# Patient Record
Sex: Female | Born: 2005 | Race: Black or African American | Hispanic: No | Marital: Single | Smoking: Never smoker
Health system: Southern US, Community
[De-identification: ages and names within clinical notes are randomized; demographics above are authoritative.]

## PROBLEM LIST (undated history)

## (undated) DIAGNOSIS — Z789 Other specified health status: Secondary | ICD-10-CM

## (undated) DIAGNOSIS — T7840XA Allergy, unspecified, initial encounter: Secondary | ICD-10-CM

## (undated) HISTORY — DX: Allergy, unspecified, initial encounter: T78.40XA

## (undated) HISTORY — PX: WISDOM TOOTH EXTRACTION: SHX21

## (undated) HISTORY — PX: NO PAST SURGERIES: SHX2092

---
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
## 2023-07-16 ENCOUNTER — Other Ambulatory Visit: Payer: Self-pay | Admitting: Family Medicine

## 2023-07-16 ENCOUNTER — Ambulatory Visit
Admission: RE | Admit: 2023-07-16 | Discharge: 2023-07-16 | Disposition: A | Discharge status: Home / Self Care | Payer: Medicaid Other | Attending: Family Medicine | Admitting: Family Medicine

## 2023-07-16 ENCOUNTER — Ambulatory Visit
Admission: RE | Admit: 2023-07-16 | Discharge: 2023-07-16 | Disposition: A | Discharge status: Home / Self Care | Payer: Medicaid Other | Source: Ambulatory Visit | Attending: Family Medicine | Admitting: Family Medicine

## 2023-07-16 DIAGNOSIS — G8929 Other chronic pain: Secondary | ICD-10-CM | POA: Insufficient documentation

## 2023-07-16 DIAGNOSIS — M545 Low back pain, unspecified: Secondary | ICD-10-CM | POA: Diagnosis present

## 2023-07-16 DIAGNOSIS — M549 Dorsalgia, unspecified: Secondary | ICD-10-CM

## 2023-07-25 ENCOUNTER — Ambulatory Visit: Payer: Medicaid Other | Attending: Family Medicine

## 2023-07-25 DIAGNOSIS — M545 Low back pain, unspecified: Secondary | ICD-10-CM | POA: Insufficient documentation

## 2023-07-25 DIAGNOSIS — M549 Dorsalgia, unspecified: Secondary | ICD-10-CM | POA: Insufficient documentation

## 2023-07-25 DIAGNOSIS — G8929 Other chronic pain: Secondary | ICD-10-CM | POA: Insufficient documentation

## 2023-07-25 NOTE — Therapy (Signed)
OUTPATIENT PHYSICAL THERAPY THORACOLUMBAR EVALUATION   Patient Name: Denise Thomas MRN: [id number] DOB:11-30-2005, 17 y.o., female Today's Date: 07/25/2023  END OF SESSION:  PT End of Session - 07/25/23 1409     Visit Number 1    Number of Visits 13    Authorization Type requested 12 visits (2x/week x 6 weeks)    PT Start Time 1415    PT Stop Time 1500    PT Time Calculation (min) 45 min    Activity Tolerance Patient tolerated treatment well    Behavior During Therapy WFL for tasks assessed/performed             Past Medical History:  Diagnosis Date   Allergies    Medical history non-contributory    Past Surgical History:  Procedure Laterality Date   NO PAST SURGERIES     WISDOM TOOTH EXTRACTION Bilateral    Patient Active Problem List   Diagnosis Date Noted   Term pregnancy 09/05/2021   Indication for care in labor and delivery, antepartum 09/03/2021   Pregnancy 07/14/2021   Type A blood, Rh positive 02/23/2021   Asthma 01/31/2021   Supervision of normal first teen pregnancy 01/31/2021    PCP: Dr. Letta Pate, MD  REFERRING PROVIDER: same  REFERRING DIAG: back pain  Rationale for Evaluation and Treatment: Rehabilitation  THERAPY DIAG:  Upper back pain  Chronic low back pain without sciatica, unspecified back pain laterality  ONSET DATE: chronic since middle schoool  SUBJECTIVE:                                                                                                                                                                                           SUBJECTIVE STATEMENT: Pt reports she is having back pain- lower, middle, and neck; no MOI; no N/T; insidous onset as a kid; she reports sx have been fairly constant at some level for years; worsened in the past year  Low back- bending down, lifting, prolonged sitting; prolonged walking; cleaning activities  Mid/upper back- feels more like stiff and achy; worse with sitting  Feels better with  pressure on her lower back, lying down "hurts good"; naproxen  Has had back pain since being a kid; no MOI; insidious progressed 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 yes  PRECAUTIONS: None  RED FLAGS: None   WEIGHT BEARING RESTRICTIONS: No  FALLS:  Has patient fallen in last 6 months? 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  DIAGNOSTIC FINDINGS:  She reports she had an x-ray in her back and she reports she has "arthritis"  PATIENT SURVEYS:  FOTO 49/56  SCREENING FOR RED FLAGS: Bowel or bladder incontinence: No Spinal tumors: No Cauda equina syndrome: No Compression fracture: No Abdominal aneurysm: No  COGNITION: Overall cognitive status: Within functional limits for tasks assessed     POSTURE: rounded shoulders  PALPATION: (+) tightness/MTP b/l thoracolumbar paraspinals (-) reproduction of pain with PAM testing: CPA/UPA L1-5 (+) hypomobile mid thoracic spine with CPA; not painful Pt notes "improvement" in sx with P/A mob sacrum and R/L SIJ  LUMBAR ROM:   AROM eval   Flexion Full *  Extension Min limited *  Right lateral flexion Min limited *  Left lateral flexion Min limited *  Right rotation Min limited *  Left rotation Min limited *   (Blank rows = not tested) *=pain  LOWER EXTREMITY ROM:     Active  Right eval Left eval  Hip flexion WNL flex, IR/ER/Add WNL flex, IR/ER/Add  Hip extension    Hip abduction    Hip adduction    Hip internal rotation    Hip external rotation    Knee flexion    Knee extension    Ankle dorsiflexion    Ankle plantarflexion    Ankle inversion    Ankle eversion     (Blank rows = not tested)  LOWER EXTREMITY MMT:    MMT Right eval Left eval  Hip flexion 4+ 4+  Hip extension 4 4  Hip abduction 4 4  Hip adduction    Hip internal rotation    Hip external rotation    Knee flexion 5 5  Knee extension 5 5  Ankle dorsiflexion    Ankle plantarflexion    Ankle inversion    Ankle eversion     (Blank rows = not tested)  LUMBAR SPECIAL TESTS:  (+) lumbar ext/rot test b/l (+) active SLR test R and L- pt notes improvement in sx with manual stabilization of pelvis (+) impaired ability to activate TA with good motor control using PT verbal/ and PT and pt tactile cues  FUNCTIONAL TESTS:  Pt demonstrates ability to perform squat, sit to stand, and forward bend- she notes that repeated forward bending and lifting at work aggravates her sx typically  GAIT: Pt amb with decreased hip extension and decreased arm swing in clinic  TODAY'S TREATMENT:  DATE: 07/25/23  Initial evaluation performed today  PATIENT EDUCATION:  Education details: PT POC/goals, purpose of motor control retraining/strengthening Person educated: Patient Education method: Explanation Education comprehension: verbalized understanding  HOME EXERCISE PROGRAM: To be initiated  ASSESSMENT:  CLINICAL  IMPRESSION: Patient is a 17 y.o. F who was seen today for physical therapy evaluation and treatment for thoacolumbar pain.  Likely associated with motor control impairments in thoracolumbar mm.  She is motivated to participate in tx and should do well with PT to address the impairments below:   OBJECTIVE IMPAIRMENTS: decreased mobility, decreased ROM, decreased strength, impaired perceived functional ability, increased muscle spasms, and pain.   ACTIVITY LIMITATIONS: carrying, lifting, bending, and caring for others  PARTICIPATION LIMITATIONS: cleaning, community activity, school, and lifting her son in/out of car  PERSONAL FACTORS: Past/current experiences and Time since onset of injury/illness/exacerbation are also affecting patient's functional outcome.   REHAB POTENTIAL: Good  CLINICAL DECISION MAKING: Stable/uncomplicated  EVALUATION COMPLEXITY: Low   GOALS: Goals reviewed with patient? Yes  SHORT TERM GOALS: Target date: 08/13/23  Pt will demonstrate ability to perform squat, floor to waist lift with good body mechanics lifting 10 lbs Baseline: able to perform the motion, does not use LE biomechanics efficiently Goal status: INITIAL    LONG TERM GOALS: Target date: 09/05/23  Improve FOTO to >56 indicating pt able to perform her daily activities without being limited by back pain Baseline: 49 Goal status: INITIAL  2.  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  Improve LE/trunk strength 1/2-1 MMT grade to promote improved ability to perform her daily activities without being limited by back pain Baseline:  Goal status: INITIAL   PLAN:  PT FREQUENCY: 1-2x/week  PT DURATION: 6 weeks  PLANNED INTERVENTIONS: 97110-Therapeutic exercises, 97530- Therapeutic activity,  97112- Neuromuscular re-education, 97535- Self Care, and 40981- Manual therapy.  PLAN FOR NEXT SESSION: begin motor control retraining, initiate HEP  Max Fickle, PT, DPT, OCS Ardine Bjork, PT 07/25/2023, 2:10 PM

## 2023-07-30 ENCOUNTER — Ambulatory Visit: Payer: Medicaid Other

## 2023-07-30 DIAGNOSIS — M549 Dorsalgia, unspecified: Secondary | ICD-10-CM

## 2023-07-30 DIAGNOSIS — M545 Low back pain, unspecified: Secondary | ICD-10-CM

## 2023-07-30 NOTE — Therapy (Signed)
OUTPATIENT PHYSICAL THERAPY THORACOLUMBAR TREATMENT   Patient Name: Denise Thomas MRN: [id number] DOB:2006/07/02, 17 y.o., female Today's Date: 07/30/2023  END OF SESSION:  PT End of Session - 07/30/23 1112     Visit Number 2    Number of Visits 13    Date for PT Re-Evaluation 09/05/23    Authorization Type requested 12 visits (2x/week x 6 weeks) 09/05/23    PT Start Time 1115    PT Stop Time 1200    PT Time Calculation (min) 45 min    Activity Tolerance Patient tolerated treatment well    Behavior During Therapy WFL for tasks assessed/performed             Past Medical History:  Diagnosis Date   Allergies    Medical history non-contributory    Past Surgical History:  Procedure Laterality Date   NO PAST SURGERIES     WISDOM TOOTH EXTRACTION Bilateral    Patient Active Problem List   Diagnosis Date Noted   Term pregnancy 09/05/2021   Indication for care in labor and delivery, antepartum 09/03/2021   Pregnancy 07/14/2021   Type A blood, Rh positive 02/23/2021   Asthma 01/31/2021   Supervision of normal first teen pregnancy 01/31/2021    PCP: Dr. Letta Pate, MD  REFERRING PROVIDER: same  REFERRING DIAG: back pain  Rationale for Evaluation and Treatment: Rehabilitation  THERAPY DIAG:  Upper back pain  Chronic low back pain without sciatica, unspecified back pain laterality  ONSET DATE: chronic since middle schoool  SUBJECTIVE:          From initial evaluation noted on 07/25/23:                                                                                                                                                                                   SUBJECTIVE STATEMENT: Pt reports she is having back pain- lower, middle, and neck; no MOI; no N/T; insidous onset as a kid; she reports sx have been fairly constant at some level for years; worsened in the past year  Low back- bending down, lifting, prolonged sitting; prolonged walking; cleaning  activities  Mid/upper back- feels more like stiff and achy; worse with sitting  Feels better with pressure on her lower back, lying down "hurts good"; naproxen  Has had back pain since being a kid; no MOI; insidious progressed 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 yes  PRECAUTIONS: None  RED FLAGS: None   WEIGHT BEARING RESTRICTIONS: No  FALLS:  Has patient fallen in last 6 months? 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  DIAGNOSTIC FINDINGS:  She reports she had an x-ray in her back and she reports she has "arthritis"  PATIENT SURVEYS:  FOTO 49/56  SCREENING FOR RED FLAGS: Bowel or bladder incontinence: No Spinal tumors: No Cauda equina syndrome: No Compression fracture: No Abdominal aneurysm: No  COGNITION: Overall cognitive status: Within functional limits for tasks assessed     POSTURE: rounded shoulders  PALPATION: (+) tightness/MTP b/l thoracolumbar paraspinals (-) reproduction of pain with PAM testing: CPA/UPA L1-5 (+) hypomobile mid thoracic spine with CPA;  not painful Pt notes "improvement" in sx with P/A mob sacrum and R/L SIJ  LUMBAR ROM:   AROM eval  Flexion Full *  Extension Min limited *  Right lateral flexion Min limited *  Left lateral flexion Min limited *  Right rotation Min limited *  Left rotation Min limited *   (Blank rows = not tested) *=pain  LOWER EXTREMITY ROM:     Active  Right eval Left eval  Hip flexion WNL flex, IR/ER/Add WNL flex, IR/ER/Add  Hip extension    Hip abduction    Hip adduction    Hip internal rotation    Hip external rotation    Knee flexion    Knee extension    Ankle dorsiflexion    Ankle plantarflexion    Ankle inversion    Ankle eversion     (Blank rows = not tested)  LOWER EXTREMITY MMT:    MMT Right eval Left eval  Hip flexion 4+ 4+  Hip extension 4 4  Hip abduction 4 4  Hip adduction    Hip internal rotation    Hip external rotation    Knee flexion 5 5  Knee extension 5 5  Ankle dorsiflexion    Ankle plantarflexion    Ankle inversion    Ankle eversion     (Blank rows = not tested)  LUMBAR SPECIAL TESTS:  (+) lumbar ext/rot test b/l (+) active SLR test R and L- pt notes improvement in sx with manual stabilization of pelvis (+) impaired ability to activate TA with good motor control using PT verbal/ and PT and pt tactile cues  FUNCTIONAL TESTS:  Pt demonstrates ability to perform squat, sit to stand, and forward bend- she notes that repeated forward bending and lifting at work aggravates her sx typically  GAIT: Pt amb with decreased hip extension and decreased arm swing in clinic  TODAY'S TREATMENT:  DATE: 07/30/23  Subjective: Pt reports her back is sore upon arrival.  And middle back is stiff.  Overall, nothing new to report since last session.  Pain: 4-5/10   Objective:  Manual Therapy: Thoracic spine CPA mobilizations T4-T12, Gr III,  30 second intervals, 4 rounds  Therapeutic Exercises: Sidelying open book (thoracic rotation): x10 R, x10 L Supine hooklying lower trunk rotation x10 ea Supine piriformis stretch: 30 sec ea LE Supine hooklying TA activation: with pt hands for self tactile cues, PT verbal cues for technique; practiced 5-6 second holds x 2 minutes; then progressed when pt able to perform well Supine TA activation with single LE march: x10 ea Supine TA activation with alternate LE march: x10 Seated TA activation: 5-6 second holds x 10  HEP instruction: gave handout from [employer]    PATIENT EDUCATION:  Education details: PT POC/goals, purpose of motor control retraining/strengthening Person educated: Patient Education method: Explanation Education comprehension: verbalized understanding  HOME EXERCISE PROGRAM: Access Code: [email or web address] URL: [email or web address] Date: 07/30/2023 Prepared by: Max Fickle  Exercises - Sidelying Thoracic Rotation with Open Book  - 1 x daily - 7 x weekly - 10 reps - Supine Lower Trunk Rotation  - 1 x daily - 7 x weekly - 10 reps - Supine Piriformis Stretch Pulling Heel to Hip  - 1 x daily - 7 x weekly - 3 reps - 15 hold - Hooklying Transversus Abdominis Palpation  - 2 x daily - 7 x weekly - 2 sets - 10 reps - 5-10 hold - Seated Transversus Abdominis Bracing  - 2 x daily - 7 x weekly - 2 sets - 10 reps - 5-1- hold  ASSESSMENT:  CLINICAL IMPRESSION: Patient demonstrated ability to perform TA activation with good technique by end of session today.  She responded well to manual therapy for thoracic spine as she was able to perform a forward bend movement with report of less stiffness/pain after intervention.  Overall, she should continue to benefit from PT to address thoracic hypomobility and motor control impairments and strength impairments in thoracolumbar/trunk/LE mm.    OBJECTIVE IMPAIRMENTS: decreased mobility, decreased ROM, decreased strength,  impaired perceived functional ability, increased muscle spasms, and pain.   ACTIVITY LIMITATIONS: carrying, lifting, bending, and caring for others  PARTICIPATION LIMITATIONS: cleaning, community activity, school, and lifting her son in/out of car  PERSONAL FACTORS: Past/current experiences and Time since onset of injury/illness/exacerbation are also affecting patient's functional outcome.   REHAB POTENTIAL: Good  CLINICAL DECISION MAKING: Stable/uncomplicated  EVALUATION COMPLEXITY: Low   GOALS: Goals reviewed with patient? Yes  SHORT TERM GOALS: Target date: 08/13/23  Pt will demonstrate ability to perform squat, floor to waist lift with good body mechanics lifting 10 lbs Baseline: able to perform the motion, does not use LE biomechanics efficiently Goal status: INITIAL    LONG TERM GOALS: Target date: 09/05/23  Improve FOTO to >56 indicating pt able to perform her daily activities without being limited by back pain Baseline: 49 Goal status: INITIAL  2.  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  Improve LE/trunk strength 1/2-1 MMT grade to promote improved ability to perform her daily activities without being limited by back pain Baseline:  Goal status: INITIAL   PLAN:  PT FREQUENCY: 1-2x/week  PT DURATION: 6  weeks  PLANNED INTERVENTIONS: 97110-Therapeutic exercises, 97530- Therapeutic activity, [id number]- Neuromuscular re-education, 97535- Self Care, and 16109- Manual therapy.  PLAN FOR NEXT SESSION: continue progressing thoracolumbar/trunk mm motor control retraining- assess periscapular mm function and add to tx if appropriate  Max Fickle, PT, DPT, OCS Ardine Bjork, PT 07/30/2023, 11:13 AM

## 2023-08-01 ENCOUNTER — Ambulatory Visit: Payer: Medicaid Other

## 2023-08-06 ENCOUNTER — Ambulatory Visit: Payer: Medicaid Other

## 2023-08-06 DIAGNOSIS — M549 Dorsalgia, unspecified: Secondary | ICD-10-CM | POA: Diagnosis not present

## 2023-08-06 DIAGNOSIS — M545 Low back pain, unspecified: Secondary | ICD-10-CM

## 2023-08-06 NOTE — Therapy (Signed)
OUTPATIENT PHYSICAL THERAPY THORACOLUMBAR TREATMENT   Patient Name: Denise Thomas MRN: [id number] DOB:January 06, 2006, 17 y.o., female Today's Date: 08/06/2023  END OF SESSION:  PT End of Session - 08/06/23 1121     Visit Number 3    Number of Visits 13    Date for PT Re-Evaluation 09/05/23    Authorization Type requested 12 visits (2x/week x 6 weeks) 09/05/23    PT Start Time 1115    PT Stop Time 1200    PT Time Calculation (min) 45 min    Activity Tolerance Patient tolerated treatment well    Behavior During Therapy WFL for tasks assessed/performed             Past Medical History:  Diagnosis Date   Allergies    Medical history non-contributory    Past Surgical History:  Procedure Laterality Date   NO PAST SURGERIES     WISDOM TOOTH EXTRACTION Bilateral    Patient Active Problem List   Diagnosis Date Noted   Term pregnancy 09/05/2021   Indication for care in labor and delivery, antepartum 09/03/2021   Pregnancy 07/14/2021   Type A blood, Rh positive 02/23/2021   Asthma 01/31/2021   Supervision of normal first teen pregnancy 01/31/2021    PCP: Dr. Letta Pate, MD  REFERRING PROVIDER: same  REFERRING DIAG: back pain  Rationale for Evaluation and Treatment: Rehabilitation  THERAPY DIAG:  Upper back pain  Chronic low back pain without sciatica, unspecified back pain laterality  ONSET DATE: chronic since middle schoool  SUBJECTIVE:          From initial evaluation noted on 07/25/23:                                                                                                                                                                                   SUBJECTIVE STATEMENT: Pt reports she is having back pain- lower, middle, and neck; no MOI; no N/T; insidous onset as a kid; she reports sx have been fairly constant at some level for years; worsened in the past year  Low back- bending down, lifting, prolonged sitting; prolonged walking; cleaning  activities  Mid/upper back- feels more like stiff and achy; worse with sitting  Feels better with pressure on her lower back, lying down "hurts good"; naproxen  Has had back pain since being a kid; no MOI; insidious progressed 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 yes  PRECAUTIONS: None  RED FLAGS: None   WEIGHT BEARING RESTRICTIONS: No  FALLS:  Has patient fallen in last 6 months? 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  DIAGNOSTIC FINDINGS:  She reports she had an x-ray in her back and she reports she has "arthritis"  PATIENT SURVEYS:  FOTO 49/56  SCREENING FOR RED FLAGS: Bowel or bladder incontinence: No Spinal tumors: No Cauda equina syndrome: No Compression fracture: No Abdominal aneurysm: No  COGNITION: Overall cognitive status: Within functional limits for tasks assessed     POSTURE: rounded shoulders  PALPATION: (+) tightness/MTP b/l thoracolumbar paraspinals (-) reproduction of pain with PAM testing: CPA/UPA L1-5 (+) hypomobile mid thoracic spine with CPA;  not painful Pt notes "improvement" in sx with P/A mob sacrum and R/L SIJ  LUMBAR ROM:   AROM eval  Flexion Full *  Extension Min limited *  Right lateral flexion Min limited *  Left lateral flexion Min limited *  Right rotation Min limited *  Left rotation Min limited *   (Blank rows = not tested) *=pain  LOWER EXTREMITY ROM:     Active  Right eval Left eval  Hip flexion WNL flex, IR/ER/Add WNL flex, IR/ER/Add  Hip extension    Hip abduction    Hip adduction    Hip internal rotation    Hip external rotation    Knee flexion    Knee extension    Ankle dorsiflexion    Ankle plantarflexion    Ankle inversion    Ankle eversion     (Blank rows = not tested)  LOWER EXTREMITY MMT:    MMT Right eval Left eval  Hip flexion 4+ 4+  Hip extension 4 4  Hip abduction 4 4  Hip adduction    Hip internal rotation    Hip external rotation    Knee flexion 5 5  Knee extension 5 5  Ankle dorsiflexion    Ankle plantarflexion    Ankle inversion    Ankle eversion     (Blank rows = not tested)  LUMBAR SPECIAL TESTS:  (+) lumbar ext/rot test b/l (+) active SLR test R and L- pt notes improvement in sx with manual stabilization of pelvis (+) impaired ability to activate TA with good motor control using PT verbal/ and PT and pt tactile cues  FUNCTIONAL TESTS:  Pt demonstrates ability to perform squat, sit to stand, and forward bend- she notes that repeated forward bending and lifting at work aggravates her sx typically  GAIT: Pt amb with decreased hip extension and decreased arm swing in clinic  TODAY'S TREATMENT:  DATE: 08/06/23  Subjective: Pt reports she is feeling less stiff in her back.  She is working on her HEP.    Pain: 4-5/10   Objective:  Manual Therapy: Thoracic spine CPA mobilizations T4-T12, Gr III, 30 second intervals, 4 rounds- not  today  Therapeutic Exercises: Sidelying open book (thoracic rotation): x10 R, x10 L Supine hooklying lower trunk rotation x10 ea Supine piriformis stretch: 30 sec ea LE Supine hooklying TA activation: with pt hands for self tactile cues, PT verbal cues for technique; practiced 5-6 second holds x 2 minutes; then progressed when pt able to perform well Supine TA activation with alternate LE march:6 reps alternating; repeated 4x Seated TA activation: 5-6 second holds x 10 TA activation + 90/90 1 up, 2 up, 1 down, 2 down x12 Supine hooklying isometric hip ER 5 second x10 Then added bridge with 3 second holds x12 TA activation + SLR x10 ea Standing rows with GTB 2x10   HEP instruction: gave handout from [employer]; showed pt how to perform rows using doorway to achor the band   PATIENT EDUCATION:  Education details: PT POC/goals, purpose of motor control retraining/strengthening Person educated: Patient Education method: Explanation Education comprehension: verbalized understanding  HOME EXERCISE PROGRAM: Access Code: [email or web address] URL: [email or web address] Date: 08/06/2023 Prepared by: Max Fickle  Exercises - Sidelying Thoracic Rotation with Open Book  - 1 x daily - 7 x weekly - 10 reps - Supine Lower Trunk Rotation  - 1 x daily - 7 x weekly - 10 reps - Supine Piriformis Stretch Pulling Heel to Hip  - 1 x daily - 7 x weekly - 3 reps - 15 hold - Hooklying Transversus Abdominis Palpation  - 2 x daily - 7 x weekly - 2 sets - 10 reps - 5-10 hold - Seated Transversus Abdominis Bracing  - 2 x daily - 7 x weekly - 2 sets - 10 reps - 5-1- hold - Standing Row with Anchored Resistance  - 1 x daily - 7 x weekly - 3 sets - 10 reps - Supine Active Straight Leg Raise  - 1 x daily - 7 x weekly - 2 sets - 10 reps - Supine Bridge with Resistance Band  - 1 x daily - 7 x weekly - 3 sets - 10 reps  ASSESSMENT:  CLINICAL IMPRESSION: Patient was challenged appropriately with  lumbopelvic/core mm retraining today.  She reports no increase in low back pain by end of session.   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  OBJECTIVE IMPAIRMENTS: decreased mobility, decreased ROM, decreased strength, impaired perceived functional ability, increased muscle spasms, and pain.   ACTIVITY LIMITATIONS: carrying, lifting, bending, and caring for others  PARTICIPATION LIMITATIONS: cleaning, community activity, school, and lifting her son in/out of car  PERSONAL FACTORS: Past/current experiences and Time since onset of injury/illness/exacerbation are also affecting patient's functional outcome.   REHAB POTENTIAL: Good  CLINICAL DECISION MAKING: Stable/uncomplicated  EVALUATION COMPLEXITY: Low   GOALS: Goals reviewed with patient? Yes  SHORT TERM GOALS: Target date: 08/13/23  Pt will demonstrate ability to perform squat, floor to waist lift with good body mechanics lifting 10 lbs Baseline: able to perform the motion, does not use LE biomechanics efficiently Goal status: INITIAL    LONG TERM GOALS: Target date: 09/05/23  Improve FOTO to >56 indicating pt able to perform her daily activities without being limited by back pain Baseline: 49 Goal status: INITIAL  2.  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  Improve LE/trunk strength 1/2-1 MMT grade to promote improved ability to perform her daily activities without being limited by back pain Baseline:  Goal status: INITIAL   PLAN:  PT FREQUENCY: 1-2x/week  PT  DURATION: 6 weeks  PLANNED INTERVENTIONS: 97110-Therapeutic exercises, 97530- Therapeutic activity, 97112- Neuromuscular re-education, 97535- Self Care, and 16109- Manual therapy.  PLAN FOR NEXT SESSION: continue progressing thoracolumbar/trunk mm motor control retraining- assess periscapular mm function and add to tx if appropriate  Max Fickle, PT, DPT, OCS Ardine Bjork, PT 08/06/2023, 11:22 AM

## 2023-08-08 ENCOUNTER — Ambulatory Visit: Payer: Medicaid Other | Admitting: Physical Therapy

## 2023-08-20 ENCOUNTER — Ambulatory Visit: Payer: Medicaid Other | Attending: Family Medicine

## 2023-08-20 DIAGNOSIS — M545 Low back pain, unspecified: Secondary | ICD-10-CM | POA: Insufficient documentation

## 2023-08-20 DIAGNOSIS — G8929 Other chronic pain: Secondary | ICD-10-CM | POA: Insufficient documentation

## 2023-08-20 DIAGNOSIS — M549 Dorsalgia, unspecified: Secondary | ICD-10-CM | POA: Insufficient documentation

## 2023-08-22 ENCOUNTER — Ambulatory Visit: Payer: Medicaid Other

## 2023-08-22 DIAGNOSIS — M545 Low back pain, unspecified: Secondary | ICD-10-CM

## 2023-08-22 DIAGNOSIS — M549 Dorsalgia, unspecified: Secondary | ICD-10-CM | POA: Diagnosis present

## 2023-08-22 DIAGNOSIS — G8929 Other chronic pain: Secondary | ICD-10-CM | POA: Diagnosis present

## 2023-08-22 NOTE — Therapy (Signed)
OUTPATIENT PHYSICAL THERAPY THORACOLUMBAR TREATMENT   Patient Name: Denise Thomas MRN: [id number] DOB:2005-09-22, 17 y.o., female Today's Date: 08/22/2023  END OF SESSION:  PT End of Session - 08/22/23 1041     Visit Number 4    Number of Visits 13    Date for PT Re-Evaluation 09/05/23    Authorization Type requested 12 visits (2x/week x 6 weeks) 09/05/23    PT Start Time 1035    PT Stop Time 1115    PT Time Calculation (min) 40 min    Activity Tolerance Patient tolerated treatment well    Behavior During Therapy WFL for tasks assessed/performed             Past Medical History:  Diagnosis Date   Allergies    Medical history non-contributory    Past Surgical History:  Procedure Laterality Date   NO PAST SURGERIES     WISDOM TOOTH EXTRACTION Bilateral    Patient Active Problem List   Diagnosis Date Noted   Term pregnancy 09/05/2021   Indication for care in labor and delivery, antepartum 09/03/2021   Pregnancy 07/14/2021   Type A blood, Rh positive 02/23/2021   Asthma 01/31/2021   Supervision of normal first teen pregnancy 01/31/2021    PCP: Dr. Letta Pate, MD  REFERRING PROVIDER: same  REFERRING DIAG: back pain  Rationale for Evaluation and Treatment: Rehabilitation  THERAPY DIAG:  Upper back pain  Chronic low back pain without sciatica, unspecified back pain laterality  ONSET DATE: chronic since middle schoool  SUBJECTIVE:          From initial evaluation noted on 07/25/23:                                                                                                                                                                                   SUBJECTIVE STATEMENT: Pt reports she is having back pain- lower, middle, and neck; no MOI; no N/T; insidous onset as a kid; she reports sx have been fairly constant at some level for years; worsened in the past year  Low back- bending down, lifting, prolonged sitting; prolonged walking; cleaning  activities  Mid/upper back- feels more like stiff and achy; worse with sitting  Feels better with pressure on her lower back, lying down "hurts good"; naproxen  Has had back pain since being a kid; no MOI; insidious progressed 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 yes  PRECAUTIONS: None  RED FLAGS: None   WEIGHT BEARING RESTRICTIONS: No  FALLS:  Has patient fallen in last 6 months? 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  DIAGNOSTIC FINDINGS:  She reports she had an x-ray in her back and she reports she has "arthritis"  PATIENT SURVEYS:  FOTO 49/56  SCREENING FOR RED FLAGS: Bowel or bladder incontinence: No Spinal tumors: No Cauda equina syndrome: No Compression fracture: No Abdominal aneurysm: No  COGNITION: Overall cognitive status: Within functional limits for tasks assessed     POSTURE: rounded shoulders  PALPATION: (+) tightness/MTP b/l thoracolumbar paraspinals (-) reproduction of pain with PAM testing: CPA/UPA L1-5 (+) hypomobile mid thoracic spine with CPA;  not painful Pt notes "improvement" in sx with P/A mob sacrum and R/L SIJ  LUMBAR ROM:   AROM eval  Flexion Full *  Extension Min limited *  Right lateral flexion Min limited *  Left lateral flexion Min limited *  Right rotation Min limited *  Left rotation Min limited *   (Blank rows = not tested) *=pain  LOWER EXTREMITY ROM:     Active  Right eval Left eval  Hip flexion WNL flex, IR/ER/Add WNL flex, IR/ER/Add  Hip extension    Hip abduction    Hip adduction    Hip internal rotation    Hip external rotation    Knee flexion    Knee extension    Ankle dorsiflexion    Ankle plantarflexion    Ankle inversion    Ankle eversion     (Blank rows = not tested)  LOWER EXTREMITY MMT:    MMT Right eval Left eval  Hip flexion 4+ 4+  Hip extension 4 4  Hip abduction 4 4  Hip adduction    Hip internal rotation    Hip external rotation    Knee flexion 5 5  Knee extension 5 5  Ankle dorsiflexion    Ankle plantarflexion    Ankle inversion    Ankle eversion     (Blank rows = not tested)  LUMBAR SPECIAL TESTS:  (+) lumbar ext/rot test b/l (+) active SLR test R and L- pt notes improvement in sx with manual stabilization of pelvis (+) impaired ability to activate TA with good motor control using PT verbal/ and PT and pt tactile cues  FUNCTIONAL TESTS:  Pt demonstrates ability to perform squat, sit to stand, and forward bend- she notes that repeated forward bending and lifting at work aggravates her sx typically  GAIT: Pt amb with decreased hip extension and decreased arm swing in clinic  TODAY'S TREATMENT:  DATE: 08/22/23  Subjective: Pt reports her back is sore.  She is having difficulty finding the space and time for working on her HEP, she is doing the best she can.  Overall feeling very stiff/tight in back/leg muscles.  Pt notes she is dealing with  a lot of stressors.  Pain: 4-5/10  Objective:  Manual Therapy: Thoracic spine CPA mobilizations T4-T12, Gr III, 30 second intervals, 4 rounds- not today  Therapeutic Exercises: SKTC x3 ea side, 20 seconds Hamstring stretch: 1 min ea with PT Sidelying open book (thoracic rotation): x10 R, x10 L Supine hooklying lower trunk rotation x10 ea Supine piriformis stretch: 30 sec ea LE Supine figure 4 stretch: 3 x 30 seconds ea Diaphragmatic breathing: throughout for physiological quieting/pain control Discussed strategies for how to incorporate movement/exercise into her daily routine for self care and sx management, pt states able to work on [employer] activation awareness during her work day   Not today: Supine hooklying TA activation: with pt hands for self tactile cues, PT verbal cues for technique; practiced 5-6 second holds x 2 minutes; then progressed when pt able to perform well Supine TA activation with alternate LE march:6 reps alternating; repeated 4x Seated TA activation: 5-6 second holds x 10 TA activation + 90/90 1 up, 2 up, 1 down, 2 down x12 Supine hooklying isometric hip ER 5 second x10 Then added bridge with 3 second holds x12 TA activation + SLR x10 ea Standing rows with GTB 2x10     HEP instruction: gave handout from medbridge; showed pt how to perform rows using doorway to achor the band   PATIENT EDUCATION:  Education details: PT POC/goals, purpose of motor control retraining/strengthening Person educated: Patient Education method: Explanation Education comprehension: verbalized understanding  HOME EXERCISE PROGRAM: Access Code: [email or web address] URL: [email or web address] Date: 08/06/2023 Prepared by: Max Fickle  Exercises - Sidelying Thoracic Rotation with Open Book  - 1 x daily - 7 x weekly - 10 reps - Supine Lower Trunk Rotation  - 1 x daily - 7 x weekly - 10 reps - Supine Piriformis Stretch Pulling Heel to Hip  - 1 x daily - 7 x weekly - 3 reps -  15 hold - Hooklying Transversus Abdominis Palpation  - 2 x daily - 7 x weekly - 2 sets - 10 reps - 5-10 hold - Seated Transversus Abdominis Bracing  - 2 x daily - 7 x weekly - 2 sets - 10 reps - 5-1- hold - Standing Row with Anchored Resistance  - 1 x daily - 7 x weekly - 3 sets - 10 reps - Supine Active Straight Leg Raise  - 1 x daily - 7 x weekly - 2 sets - 10 reps - Supine Bridge with Resistance Band  - 1 x daily - 7 x weekly - 3 sets - 10 reps  ASSESSMENT:  CLINICAL IMPRESSION: PT interventions today emphasized thoracolumbar mobility and physiological quieting techniques for pain control and self care and discussion of realistic strategies to increase her movement/mobility during her day instead of having to designate a separate time for HEP at home which may help improve adherence with HEP. 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  OBJECTIVE IMPAIRMENTS: decreased mobility, decreased ROM, decreased strength, impaired perceived functional ability, increased muscle spasms, and  pain.   ACTIVITY LIMITATIONS: carrying, lifting, bending, and caring for others  PARTICIPATION LIMITATIONS: cleaning, community activity, school, and lifting her son in/out of car  PERSONAL FACTORS: Past/current experiences and Time since onset of injury/illness/exacerbation are also affecting patient's functional outcome.   REHAB POTENTIAL: Good  CLINICAL DECISION MAKING: Stable/uncomplicated  EVALUATION COMPLEXITY: Low   GOALS: Goals reviewed with patient? Yes  SHORT TERM GOALS: Target date: 08/13/23  Pt will demonstrate ability to perform squat, floor to waist lift with good body mechanics lifting 10 lbs Baseline: able to perform the motion, does not  use LE biomechanics efficiently Goal status: INITIAL    LONG TERM GOALS: Target date: 09/05/23  Improve FOTO to >56 indicating pt able to perform her daily activities without being limited by back pain Baseline: 49 Goal status: INITIAL  2.  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  Improve LE/trunk strength 1/2-1 MMT grade to promote improved ability to perform her daily activities without being limited by back pain Baseline:  Goal status: INITIAL   PLAN:  PT FREQUENCY: 1-2x/week  PT DURATION: 6 weeks  PLANNED INTERVENTIONS: 97110-Therapeutic exercises, 97530- Therapeutic activity, 97112- Neuromuscular re-education, 97535- Self Care, and 78295- Manual therapy.  PLAN FOR NEXT SESSION: continue progressing thoracolumbar/trunk mm motor control retraining- assess periscapular mm function and add to tx if appropriate  Max Fickle, PT, DPT, OCS Ardine Bjork, PT 08/22/2023, 10:42 AM

## 2023-08-28 ENCOUNTER — Ambulatory Visit: Payer: Medicaid Other | Admitting: Physical Therapy

## 2023-08-28 DIAGNOSIS — M549 Dorsalgia, unspecified: Secondary | ICD-10-CM

## 2023-08-28 DIAGNOSIS — G8929 Other chronic pain: Secondary | ICD-10-CM

## 2023-08-28 NOTE — Therapy (Unsigned)
OUTPATIENT PHYSICAL THERAPY THORACOLUMBAR TREATMENT   Patient Name: Denise Thomas MRN: [id number] DOB:[birth year], 17 y.o., female Today's Date: 08/28/2023  END OF SESSION:  PT End of Session - 08/28/23 1609     Visit Number 5    Number of Visits 13    Date for PT Re-Evaluation 09/05/23    Authorization Type requested 12 visits (2x/week x 6 weeks) 09/05/23    PT Start Time 1607    PT Stop Time 1647    PT Time Calculation (min) 40 min    Activity Tolerance Patient tolerated treatment well    Behavior During Therapy WFL for tasks assessed/performed              Past Medical History:  Diagnosis Date   Allergies    Medical history non-contributory    Past Surgical History:  Procedure Laterality Date   NO PAST SURGERIES     WISDOM TOOTH EXTRACTION Bilateral    Patient Active Problem List   Diagnosis Date Noted   Term pregnancy 09/05/2021   Indication for care in labor and delivery, antepartum 09/03/2021   Pregnancy 07/14/2021   Type A blood, Rh positive 02/23/2021   Asthma 01/31/2021   Supervision of normal first teen pregnancy 01/31/2021    PCP: Dr. Letta Pate, MD  REFERRING PROVIDER: same  REFERRING DIAG: back pain  Rationale for Evaluation and Treatment: Rehabilitation  THERAPY DIAG:  Upper back pain  Chronic low back pain without sciatica, unspecified back pain laterality  ONSET DATE: chronic since middle schoool  SUBJECTIVE:          From initial evaluation noted on 07/25/23:                                                                                                                                                                                   SUBJECTIVE STATEMENT: Pt reports she is having back pain- lower, middle, and neck; no MOI; no N/T; insidous onset as a kid; she reports sx have been fairly constant at some level for years; worsened in the past year  Low back- bending down, lifting, prolonged sitting; prolonged walking; cleaning  activities  Mid/upper back- feels more like stiff and achy; worse with sitting  Feels better with pressure on her lower back, lying down "hurts good"; naproxen  Has had back pain since being a kid; no MOI; insidious progressed as 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 yes  PRECAUTIONS: None  RED FLAGS: None   WEIGHT BEARING RESTRICTIONS: No  FALLS:  Has patient fallen in last 6 months? 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  DIAGNOSTIC FINDINGS:  She reports she had an x-ray in her back and she reports she has "arthritis"  PATIENT SURVEYS:  FOTO 49/56  SCREENING FOR RED FLAGS: Bowel or bladder incontinence: No Spinal tumors: No Cauda equina syndrome: No Compression fracture: No Abdominal aneurysm: No  COGNITION: Overall cognitive status: Within functional limits for tasks assessed     POSTURE: rounded shoulders  PALPATION: (+) tightness/MTP b/l thoracolumbar paraspinals (-) reproduction of pain with PAM testing: CPA/UPA L1-5 (+) hypomobile mid thoracic spine with CPA;  not painful Pt notes "improvement" in sx with P/A mob sacrum and R/L SIJ  LUMBAR ROM:   AROM eval  Flexion Full *  Extension Min limited *  Right lateral flexion Min limited *  Left lateral flexion Min limited *  Right rotation Min limited *  Left rotation Min limited *   (Blank rows = not tested) *=pain  LOWER EXTREMITY ROM:     Active  Right eval Left eval  Hip flexion WNL flex, IR/ER/Add WNL flex, IR/ER/Add  Hip extension    Hip abduction    Hip adduction    Hip internal rotation    Hip external rotation    Knee flexion    Knee extension    Ankle dorsiflexion    Ankle plantarflexion    Ankle inversion    Ankle eversion     (Blank rows = not tested)  LOWER EXTREMITY MMT:    MMT Right eval Left eval  Hip flexion 4+ 4+  Hip extension 4 4  Hip abduction 4 4  Hip adduction    Hip internal rotation    Hip external rotation    Knee flexion 5 5  Knee extension 5 5  Ankle dorsiflexion    Ankle plantarflexion    Ankle inversion    Ankle eversion     (Blank rows = not tested)  LUMBAR SPECIAL TESTS:  (+) lumbar ext/rot test b/l (+) active SLR test R and L- pt notes improvement in sx with manual stabilization of pelvis (+) impaired ability to activate TA with good motor control using PT verbal/ and PT and pt tactile cues  FUNCTIONAL TESTS:  Pt demonstrates ability to perform squat, sit to stand, and forward bend- she notes that repeated forward bending and lifting at work aggravates her sx typically  GAIT: Pt amb with decreased hip extension and decreased arm swing in clinic   TODAY'S TREATMENT:  DATE: 08/28/23  Subjective: Pt reports she is in a lot of pain today; more pain in low back. Pt reports some pain to gluteal region more on R than L side. Patient reports difficulty with keeping up her HEP due to time constraint. Patient reports  no recent numbness/tingling. She reports limited volume of sleep - pt has 6 kids in home between her and her sister's children; she has 84-year-old son of her own.   Pain: 4-5/10  Objective:  Manual Therapy: Manual lumbar traction in supine; 10 sec intervals; x 5 minutes for nerve root decompression and symptom modulation   Thoracic spine CPA mobilizations T4-T12, Gr III, 30 second intervals, 4 rounds- not today  Therapeutic Exercises:  Supine hooklying lower trunk rotation x10 ea Sidelying open book (thoracic rotation): x10 R, x10 L  Self-traction, hooklying, pt pushing on bilateral proximal thighs with both hands; x 10 sec hold  -reviewed for home  AROM screen Lumbar flexion: Full, no pain Lumbar extension: Full, no pain  Lateral flexion: R Full, no pain, L Full, no pain Thoracolumbar rotation: R 75%*, L WNL   Supine piriformis stretch: 2 x 30 sec ea LE Supine figure 4 stretch: 2 x 30 seconds ea Supine TA activation with alternate LE march: 8 reps alternating; repeated 2x TA activation + 90/90 1 up, 2 up, 1 down, 2 down [employer] with 3 second holds x15  Discussed strategies for how to incorporate movement/exercise into her daily routine for self care and sx management, pt states able to work on [employer] activation awareness during her work day   Not today: SKTC x3 ea side, 20 seconds Hamstring stretch: 1 min ea with PT Diaphragmatic breathing: throughout for physiological quieting/pain control Supine hooklying TA activation: with pt hands for self tactile cues, PT verbal cues for technique; practiced 5-6 second holds x 2 minutes; then progressed when pt able to perform well Seated TA activation: 5-6 second holds x 10 Supine hooklying isometric hip ER 5 second x10 TA activation + SLR x10 ea Standing rows with GTB 2x10     HEP instruction: gave handout from [employer]; showed pt how to perform rows using doorway to achor the band   PATIENT EDUCATION:  Education details:  PT POC/goals, purpose of motor control retraining/strengthening Person educated: Patient Education method: Explanation Education comprehension: verbalized understanding  HOME EXERCISE PROGRAM: Access Code: [email or web address] URL: [email or web address] Date: 08/06/2023 Prepared by: Max Fickle  Exercises - Sidelying Thoracic Rotation with Open Book  - 1 x daily - 7 x weekly - 10 reps - Supine Lower Trunk Rotation  - 1 x daily - 7 x weekly - 10 reps - Supine Piriformis Stretch Pulling Heel to Hip  - 1 x daily - 7 x weekly - 3 reps - 15 hold - Hooklying Transversus Abdominis Palpation  - 2 x daily - 7 x weekly - 2 sets - 10 reps - 5-10 hold - Seated Transversus Abdominis Bracing  - 2 x daily - 7 x weekly - 2 sets - 10 reps - 5-1- hold - Standing Row with Anchored Resistance  - 1 x daily - 7 x weekly - 3 sets - 10 reps - Supine Active Straight Leg Raise  - 1 x daily - 7 x weekly - 2 sets - 10 reps - Supine Bridge with Resistance Band  - 1 x daily - 7 x weekly - 3 sets - 10 reps  ASSESSMENT:  CLINICAL IMPRESSION: PT interventions today emphasized thoracolumbar mobility and physiological quieting techniques  for pain control and self care and discussion of realistic strategies to increase her movement/mobility during her day instead of having to designate a separate time for HEP at home which may help improve adherence with HEP. 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  OBJECTIVE IMPAIRMENTS: decreased mobility, decreased ROM, decreased strength, impaired perceived functional ability, increased muscle spasms, and pain.   ACTIVITY LIMITATIONS: carrying, lifting, bending, and caring for others  PARTICIPATION LIMITATIONS: cleaning,  community activity, school, and lifting her son in/out of car  PERSONAL FACTORS: Past/current experiences and Time since onset of injury/illness/exacerbation are also affecting patient's functional outcome.   REHAB POTENTIAL: Good  CLINICAL DECISION MAKING: Stable/uncomplicated  EVALUATION COMPLEXITY: Low   GOALS: Goals reviewed with patient? Yes  SHORT TERM GOALS: Target date: 08/13/23  Pt will demonstrate ability to perform squat, floor to waist lift with good body mechanics lifting 10 lbs Baseline: able to perform the motion, does not use LE biomechanics efficiently Goal status: INITIAL    LONG TERM GOALS: Target date: 09/05/23  Improve FOTO to >56 indicating pt able to perform her daily activities without being limited by back pain Baseline: 49 Goal status: INITIAL  2.  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  Improve LE/trunk strength 1/2-1 MMT grade to promote improved ability to perform her daily activities without being limited by back pain Baseline:  Goal status: INITIAL   PLAN:  PT FREQUENCY: 1-2x/week  PT DURATION: 6 weeks  PLANNED INTERVENTIONS: 97110-Therapeutic exercises, 97530- Therapeutic activity, 97112- Neuromuscular re-education, 97535- Self Care, and 16109- Manual therapy.  PLAN FOR NEXT SESSION: continue progressing thoracolumbar/trunk mm motor control retraining- assess periscapular mm function and add to tx if appropriate   Consuela Mimes, PT, DPT #[id number]  Gertie Exon, PT 08/28/2023, 4:09 PM

## 2023-08-29 ENCOUNTER — Encounter: Payer: Self-pay | Admitting: Physical Therapy

## 2023-09-05 ENCOUNTER — Ambulatory Visit: Payer: Medicaid Other

## 2023-11-16 ENCOUNTER — Ambulatory Visit: Payer: Self-pay

## 2023-11-16 NOTE — Telephone Encounter (Signed)
 Chief Complaint: Fingernail torn off Symptoms: mild inflammation Frequency: last night Pertinent Negatives: Patient denies fever, discharge, pus Disposition: [] ED /[] Urgent Care (no appt availability in office) / [] Appointment(In office/virtual)/ []  [facility]/ [x] Home Care/ [] Refused Recommended Disposition /[] [facility]/ []  Follow-up with PCP Additional Notes: Patient called in stating she fell last night and does not have any injuries except a scraped knee and a torn fingernail. Patient's intent of call was to seek advice on how to treat half torn fingernail. This RN advised of home care treatment and s/s of infection to be aware of and to call us back immediately if these symptoms occur.   Copied from CRM [id number]. Topic: Clinical - Red Word Triage >> Nov 16, 2023 10:50 AM Denise Thomas wrote: Red Word that prompted transfer to Nurse Triage: artificial nail pulled off after pt took a fall around 8:30 pm last night.. Painful. Bleeding, pulled off down to skin, just hanging. Needs guidance. Reason for Disposition  [1] Fingernail comes off or is almost off AND [2] follows old injury  Answer Assessment - Initial Assessment Questions 1. MECHANISM: "How did the injury happen?"      Patient fell and ripped nail off 2. ONSET: "When did the injury happen?" (Minutes or hours ago)      Last night 3. LOCATION: "What part of the finger is injured?" "Is the nail damaged?"      Middle finger on right hand 4. APPEARANCE of the INJURY: "What does the injury look like?"      Half of nail is missing, nail is still connected in some places, a little swollen  5. SEVERITY: "Can you use the hand normally?"  "Can you bend your fingers into a ball and then fully open them?"     Yes  6. SIZE: For cuts, bruises, or swelling, ask: "How large is it?" (e.g., inches or centimeters;  entire finger)      N/a 7. PAIN: "Is there pain?" If Yes, ask: "How bad is the pain?"    (e.g., Scale 1-10; or  mild, moderate, severe)  - NONE (0): no pain.  - MILD (1-3): doesn't interfere with normal activities.   - MODERATE (4-7): interferes with normal activities or awakens from sleep.  - SEVERE (8-10): excruciating pain, unable to hold a glass of water or bend finger even a little.     2 8. TETANUS: For any breaks in the skin, ask: "When was the last tetanus booster?"     Patient had tetanus shot recently  9. OTHER SYMPTOMS: "Do you have any other symptoms?" No  Protocols used: Finger Injury-A-AH

## 2024-01-23 ENCOUNTER — Ambulatory Visit

## 2024-01-23 VITALS — BP 133/79 | Ht 65.5 in | Wt 201.0 lb

## 2024-01-23 DIAGNOSIS — Z309 Encounter for contraceptive management, unspecified: Secondary | ICD-10-CM | POA: Diagnosis not present

## 2024-01-23 DIAGNOSIS — Z30012 Encounter for prescription of emergency contraception: Secondary | ICD-10-CM

## 2024-01-23 MED ORDER — ELLA 30 MG PO TABS: 1.0000 | ORAL_TABLET | Freq: Once | ORAL | 0 refills | Status: AC

## 2024-01-23 NOTE — Progress Notes (Signed)
 In nurse clinic for ECP. LMP 01/09/24. Last sex 01/22/24. ECP information read and given to patient. ECP consent form signed. Rx for Ella pill was sent to patient's pharmacy of choice. Patient reports she is interested in [facility] but unsure of which one; advised patient to schedule family planning appt. [facility] resources provided to patient.   All questions answered and verbalizes understanding.   Clare Critchley, RN

## 2024-01-31 ENCOUNTER — Ambulatory Visit

## 2024-02-03 ENCOUNTER — Emergency Department (HOSPITAL_COMMUNITY)

## 2024-02-03 ENCOUNTER — Other Ambulatory Visit: Payer: Self-pay

## 2024-02-03 ENCOUNTER — Encounter (HOSPITAL_COMMUNITY): Payer: Self-pay

## 2024-02-03 ENCOUNTER — Emergency Department (HOSPITAL_COMMUNITY)
Admission: EM | Admit: 2024-02-03 | Discharge: 2024-02-03 | Disposition: A | Discharge status: Home / Self Care | Attending: Emergency Medicine | Admitting: Emergency Medicine

## 2024-02-03 DIAGNOSIS — R1030 Lower abdominal pain, unspecified: Secondary | ICD-10-CM

## 2024-02-03 DIAGNOSIS — N83201 Unspecified ovarian cyst, right side: Secondary | ICD-10-CM | POA: Insufficient documentation

## 2024-02-03 LAB — URINALYSIS, ROUTINE W REFLEX MICROSCOPIC
Bilirubin Urine: NEGATIVE
Glucose, UA: NEGATIVE mg/dL
Hgb urine dipstick: NEGATIVE
Ketones, ur: NEGATIVE mg/dL
Leukocytes,Ua: NEGATIVE
Nitrite: NEGATIVE
Protein, ur: 30 mg/dL — AB
Specific Gravity, Urine: 1.028 (ref 1.005–1.030)
pH: 6 (ref 5.0–8.0)

## 2024-02-03 LAB — COMPREHENSIVE METABOLIC PANEL WITH GFR
ALT: 16 U/L (ref 0–44)
AST: 18 U/L (ref 15–41)
Albumin: 4 g/dL (ref 3.5–5.0)
Alkaline Phosphatase: 37 U/L — ABNORMAL LOW (ref 38–126)
Anion gap: 6 (ref 5–15)
BUN: 9 mg/dL (ref 6–20)
CO2: 24 mmol/L (ref 22–32)
Calcium: 9 mg/dL (ref 8.9–10.3)
Chloride: 108 mmol/L (ref 98–111)
Creatinine, Ser: 0.77 mg/dL (ref 0.44–1.00)
GFR, Estimated: >60 mL/min
Glucose, Bld: 100 mg/dL — ABNORMAL HIGH (ref 70–99)
Potassium: 3.9 mmol/L (ref 3.5–5.1)
Sodium: 138 mmol/L (ref 135–145)
Total Bilirubin: 0.8 mg/dL (ref 0.0–1.2)
Total Protein: 7 g/dL (ref 6.5–8.1)

## 2024-02-03 LAB — CBC
HCT: 36.6 % (ref 36.0–46.0)
Hemoglobin: 11.9 g/dL — ABNORMAL LOW (ref 12.0–15.0)
MCH: 26.6 pg (ref 26.0–34.0)
MCHC: 32.5 g/dL (ref 30.0–36.0)
MCV: 81.9 fL (ref 80.0–100.0)
Platelets: 267 K/uL (ref 150–400)
RBC: 4.47 MIL/uL (ref 3.87–5.11)
RDW: 14.4 % (ref 11.5–15.5)
WBC: 5.2 K/uL (ref 4.0–10.5)
nRBC: 0 % (ref 0.0–0.2)

## 2024-02-03 LAB — LIPASE, BLOOD: Lipase: 37 U/L (ref 11–51)

## 2024-02-03 LAB — HCG, SERUM, QUALITATIVE: Preg, Serum: NEGATIVE

## 2024-02-03 MED ORDER — MORPHINE SULFATE (PF) 4 MG/ML IV SOLN
4.0000 mg | Freq: Once | INTRAVENOUS | Status: AC
  Administered 2024-02-03: 4 mg via INTRAVENOUS
  Filled 2024-02-03: qty 1

## 2024-02-03 MED ORDER — KETOROLAC TROMETHAMINE 15 MG/ML IJ SOLN
15.0000 mg | Freq: Once | INTRAMUSCULAR | Status: AC
  Administered 2024-02-03: 15 mg via INTRAMUSCULAR
  Filled 2024-02-03: qty 1

## 2024-02-03 MED ORDER — NAPROXEN 500 MG PO TABS: 500.0000 mg | ORAL_TABLET | Freq: Two times a day (BID) | ORAL | 0 refills | Status: AC

## 2024-02-03 MED ORDER — IOHEXOL 350 MG/ML SOLN
75.0000 mL | Freq: Once | INTRAVENOUS | Status: AC | PRN
  Administered 2024-02-03: 75 mL via INTRAVENOUS

## 2024-02-03 MED ORDER — ONDANSETRON 4 MG PO TBDP
4.0000 mg | ORAL_TABLET | Freq: Once | ORAL | Status: AC
  Administered 2024-02-03: 4 mg via ORAL
  Filled 2024-02-03: qty 1

## 2024-02-03 MED ORDER — PROCHLORPERAZINE MALEATE 5 MG PO TABS
10.0000 mg | ORAL_TABLET | Freq: Once | ORAL | Status: AC
  Administered 2024-02-03: 10 mg via ORAL
  Filled 2024-02-03: qty 2

## 2024-02-03 MED ORDER — NAPROXEN 250 MG PO TABS
500.0000 mg | ORAL_TABLET | Freq: Once | ORAL | Status: AC
  Administered 2024-02-03: 500 mg via ORAL
  Filled 2024-02-03: qty 2

## 2024-02-03 NOTE — ED Triage Notes (Signed)
 Pt arrived POV from home c/o generalized abdominal pain that started yesterday. Pt also endorses nausea since September but then the pain started yesterday.

## 2024-02-03 NOTE — ED Notes (Addendum)
 Pt found with fast food bag in bed and drink in her hand brought in by visitor. Pt reminded she is not allowed to have anything by mouth in case of surgical emergency.

## 2024-02-03 NOTE — Discharge Instructions (Addendum)
 Your CT showed a right ovarian cyst, given a short course of anti-inflammatories are to help with symptoms.  If you experience any fever, worsening pain please return to the emergency department.

## 2024-02-03 NOTE — ED Notes (Addendum)
 Pt off unit to CT

## 2024-02-03 NOTE — ED Provider Notes (Signed)
 [facility] Provider Note   CSN: [id number] Arrival date & time: 02/03/24  0515     History  Chief Complaint  Patient presents with   Abdominal Pain    Denise Thomas is a 18 y.o. female.  18 y.o female with no PMH presents to the ED with a chief complaint of lower abdominal pain has been ongoing for the past 2 weeks.  Describing it as a constant ache, kind of like "whenever you have not eaten anything all day ".  She did take a Plan B on May 5, reports her abdominal cramping sort of started then.  She is also endorsing some nausea which she has had since September, has seen her PCP for this but has not been able to get any relief.  She did not take any medication for improvement in her symptoms.  Her last bowel movement was yesterday without any blood present in her stool.  No urinary symptoms, no vaginal discharge, no vomiting. Last menstrual period on 01/09/2024    The history is provided by the patient.  Abdominal Pain Pain location:  Generalized Pain quality: aching   Pain radiates to:  Does not radiate Pain severity:  Moderate Onset quality:  Gradual Duration:  2 weeks Timing:  Constant Progression:  Unchanged Chronicity:  New Context: recent sexual activity   Context: not alcohol use, not laxative use and not recent illness   Relieved by:  Nothing Worsened by:  Nothing Ineffective treatments:  None tried Associated symptoms: nausea   Associated symptoms: no constipation, no fever, no vaginal discharge and no vomiting        Home Medications Prior to Admission medications   Medication Sig Start Date End Date Taking? Authorizing Provider  naproxen  (NAPROSYN ) 500 MG tablet Take 1 tablet (500 mg total) by mouth 2 (two) times daily for 7 days. 02/03/24 02/10/24 Yes Kaydn Kumpf, PA-C  ibuprofen  (ADVIL ) 600 MG tablet Take 1 tablet (600 mg total) by mouth every 6 (six) hours. 09/07/21   Zenobia Hila, MD  Prenatal Vit-Fe  Phos-FA-Omega (VITAFOL  GUMMIES) 3.33-0.333-34.8 MG CHEW Chew 3 tablets by mouth daily. 04/28/21   Zenobia Hila, MD  sertraline  (ZOLOFT ) 50 MG tablet Take 1 tablet (50 mg total) by mouth daily. 09/27/21   Zenobia Hila, MD      Allergies    Penicillins    Review of Systems   Review of Systems  Constitutional:  Negative for fever.  Gastrointestinal:  Positive for abdominal pain and nausea. Negative for constipation and vomiting.  Genitourinary:  Negative for vaginal discharge.  All other systems reviewed and are negative.   Physical Exam Updated Vital Signs BP 132/80 (BP Location: Right Arm)   Pulse (!) 58   Temp 98.4 F (36.9 C) (Oral)   Resp 16   Ht 5\' 5"  (1.651 m)   Wt 90.7 kg   LMP 01/09/2024   SpO2 100%   BMI 33.28 kg/m  Physical Exam Vitals and nursing note reviewed.  Constitutional:      General: She is not in acute distress.    Appearance: She is well-developed.  HENT:     Head: Normocephalic and atraumatic.     Mouth/Throat:     Pharynx: No oropharyngeal exudate.  Eyes:     Pupils: Pupils are equal, round, and reactive to light.  Cardiovascular:     Rate and Rhythm: Regular rhythm.     Heart sounds: Normal heart sounds.  Pulmonary:  Effort: Pulmonary effort is normal. No respiratory distress.     Breath sounds: Normal breath sounds.  Abdominal:     General: Bowel sounds are normal. There is no distension.     Palpations: Abdomen is soft.     Tenderness: There is abdominal tenderness in the right lower quadrant, suprapubic area and left lower quadrant. There is no right CVA tenderness or left CVA tenderness.  Musculoskeletal:        General: No tenderness or deformity.     Cervical back: Normal range of motion.     Right lower leg: No edema.     Left lower leg: No edema.  Skin:    General: Skin is warm and dry.  Neurological:     Mental Status: She is alert and oriented to person, place, and time.     ED Results / Procedures / Treatments    Labs (all labs ordered are listed, but only abnormal results are displayed) Labs Reviewed  COMPREHENSIVE METABOLIC PANEL WITH GFR - Abnormal; Notable for the following components:      Result Value   Glucose, Bld 100 (*)    Alkaline Phosphatase 37 (*)    All other components within normal limits  CBC - Abnormal; Notable for the following components:   Hemoglobin 11.9 (*)    All other components within normal limits  URINALYSIS, ROUTINE W REFLEX MICROSCOPIC - Abnormal; Notable for the following components:   APPearance HAZY (*)    Protein, ur 30 (*)    Bacteria, UA RARE (*)    All other components within normal limits  LIPASE, BLOOD  HCG, SERUM, QUALITATIVE    EKG None  Radiology CT ABDOMEN PELVIS W CONTRAST Result Date: 02/03/2024 CLINICAL DATA:  Left lower quadrant abdominal pain EXAM: CT ABDOMEN AND PELVIS WITH CONTRAST TECHNIQUE: Multidetector CT imaging of the abdomen and pelvis was performed using the standard protocol following bolus administration of intravenous contrast. RADIATION DOSE REDUCTION: This exam was performed according to the departmental dose-optimization program which includes automated exposure control, adjustment of the mA and/or kV according to patient size and/or use of iterative reconstruction technique. CONTRAST:  75mL OMNIPAQUE  IOHEXOL  350 MG/ML SOLN COMPARISON:  None Available. FINDINGS: Lower chest: No acute abnormality. Hepatobiliary: No focal liver abnormality is seen. No gallstones, gallbladder wall thickening, or biliary dilatation. Pancreas: Unremarkable. No pancreatic ductal dilatation or surrounding inflammatory changes. Spleen: Normal in size without focal abnormality. Adrenals/Urinary Tract: Adrenal glands are unremarkable. Kidneys are normal, without renal calculi, focal lesion, or hydronephrosis. Bladder is unremarkable. Stomach/Bowel: Stomach is within normal limits. Appendix appears normal. No evidence of bowel wall thickening, distention, or  inflammatory changes. Vascular/Lymphatic: No significant vascular findings are present. No enlarged abdominal or pelvic lymph nodes. Reproductive: A right ovarian cyst is suspected estimated at 2.7 cm on image 65 of series 3. A small volume of free fluid is present in the pelvis. Other: No abdominal wall hernia or abnormality. No abdominopelvic ascites. Musculoskeletal: No acute or significant osseous findings. IMPRESSION: 1. A right ovarian cyst is suspected with a small amount of free pelvic fluid. 2. No evidence of diverticulitis or appendicitis. Electronically Signed   By: Reagan Camera M.D.   On: 02/03/2024 13:26    Procedures Procedures    Medications Ordered in ED Medications  naproxen  (NAPROSYN ) tablet 500 mg (500 mg Oral Given 02/03/24 0732)  ondansetron  (ZOFRAN -ODT) disintegrating tablet 4 mg (4 mg Oral Given 02/03/24 0732)  ketorolac  (TORADOL ) 15 MG/ML injection 15 mg (15 mg Intramuscular  Given 02/03/24 0858)  prochlorperazine  (COMPAZINE ) tablet 10 mg (10 mg Oral Given 02/03/24 0857)  morphine  (PF) 4 MG/ML injection 4 mg (4 mg Intravenous Given 02/03/24 1131)  iohexol  (OMNIPAQUE ) 350 MG/ML injection 75 mL (75 mLs Intravenous Contrast Given 02/03/24 1311)    ED Course/ Medical Decision Making/ A&P                                 Medical Decision Making Amount and/or Complexity of Data Reviewed Labs: ordered. Radiology: ordered.  Risk Prescription drug management.    This patient presents to the ED for concern of lower abdominal pain, this involves a number of treatment options, and is a complaint that carries with high risk of complications and morbidity.  The differential diagnosis includes appendicitis, diverticulitis, menstrual cramps, versus viral illness versus ovarian torsion.   Co morbidities: Discussed in HPI   Brief History:  See HPI  EMR reviewed including pt PMHx, past surgical history and past visits to ER.   See HPI for more details   Lab Tests:  I  ordered and independently interpreted labs.  The pertinent results include:    I personally reviewed all laboratory work and imaging. Metabolic panel without any acute abnormality specifically kidney function within normal limits and no significant electrolyte abnormalities. CBC without leukocytosis or significant anemia.  Imaging Studies:  CT Abdomen and pelvis showed: IMPRESSION:  1. A right ovarian cyst is suspected with a small amount of free  pelvic fluid.  2. No evidence of diverticulitis or appendicitis.      Electronically Signed   Medicines ordered:  I ordered medication including zofran , narpoxen, toradol  and compazine   for symptomatic treatment  Reevaluation of the patient after these medicines showed that the patient stayed the same I have reviewed the patients home medicines and have made adjustments as needed  Reevaluation:  After the interventions noted above I re-evaluated patient and found that they have :improved   Social Determinants of Health:  The patient's social determinants of health were a factor in the care of this patient  Problem List / ED Course:  Patient presented to the ED with lower abdominal pain has been ongoing since yesterday, describing as an aching sensation, "like whenever you have not eaten and are hungry ".  She did not try any medication at home aside from some Tylenol  without any improvement.  Also endorsing nausea but this has been ongoing since the month of September, does have Zofran  at home which she has been taking without any improvement in her symptoms.  Arrived to the ED in stable condition, she is afebrile, normotensive, not hypoxic.  No tachycardia. CBC with no leukocytosis, hemoglobin is within normal limits.  She does not have any vaginal bleeding, last menstrual cycle was last month.  CMP without any electrolyte derangement, creatinine levels unremarkable.  LFTs is within normal limits, no pain along the right upper quadrant to  suggest gallbladder etiology.  Pregnancy test is negative.  Lipase levels normal.  She does report aching Plan B approximately 2 weeks ago, some suspicion for menstrual cramps at this time.  Given naproxen , Zofran  without much improvement in symptoms.  Repeated exam, will provide with further medication to help evaluate. Continues to report nonfocal pain along the lower abdomen, is adamant that need imaging, do not feel that pain is focal, but at patients request will obtain CT.  I was informed by nursing staff that patient  is requesting additional pain medication for worsening abdominal pain, however she is currently eating lunch that was brought in by her visitor. CT abdomen and pelvis did show a right ovarian cyst, patient does not have a fever, no leukocytosis, I do not suspect an infectious process at this time.  She is due to have her menstrual cycle in a couple days, suspect likely consistent with her cycle.  Consider torsion, however patient has had relief after morphine , sitting on her computer in the bed in no distress.  We discussed a short course of NSAIDs to help with symptomatic treatment.  She is requesting a note for school and work.  This will be provided for patient today.  Hemodynamically stable for discharge.  Dispostion:  After consideration of the diagnostic results and the patients response to treatment, I feel that the patent would benefit from supportive treatment of pain control.   Portions of this note were generated with Scientist, clinical (histocompatibility and immunogenetics). Dictation errors may occur despite best attempts at proofreading.   Final Clinical Impression(s) / ED Diagnoses Final diagnoses:  Lower abdominal pain  Right ovarian cyst    Rx / DC Orders ED Discharge Orders          Ordered    naproxen  (NAPROSYN ) 500 MG tablet  2 times daily        02/03/24 1408              Sadrac Zeoli, PA-C 02/03/24 1439    Edson Graces, MD 02/04/24 [phone]

## 2024-02-28 ENCOUNTER — Ambulatory Visit

## 2024-03-17 ENCOUNTER — Ambulatory Visit (LOCAL_COMMUNITY_HEALTH_CENTER)

## 2024-03-17 DIAGNOSIS — Z5321 Procedure and treatment not carried out due to patient leaving prior to being seen by health care provider: Secondary | ICD-10-CM

## 2024-03-17 DIAGNOSIS — Z3202 Encounter for pregnancy test, result negative: Secondary | ICD-10-CM

## 2024-03-17 DIAGNOSIS — Z309 Encounter for contraceptive management, unspecified: Secondary | ICD-10-CM | POA: Diagnosis not present

## 2024-03-17 LAB — PREGNANCY, URINE: Preg Test, Ur: NEGATIVE

## 2024-03-17 NOTE — Progress Notes (Signed)
 UPT negative. Patient left [facility] after lab and without being seen by nurse to review results.  RN checked the waiting areas and bathrooms on health dept floor and patient is no where to be found. RN placed phone call to patient and no answer and message left on voicemail to contact [facility] nurse at [phone]. Heinrich Fertig, RN

## 2024-04-04 ENCOUNTER — Encounter (HOSPITAL_COMMUNITY): Payer: Self-pay | Admitting: *Deleted

## 2024-04-04 ENCOUNTER — Emergency Department (HOSPITAL_COMMUNITY)
Admission: EM | Admit: 2024-04-04 | Discharge: 2024-04-05 | Discharge status: Against Medical Advice | Attending: Emergency Medicine | Admitting: Emergency Medicine

## 2024-04-04 ENCOUNTER — Other Ambulatory Visit: Payer: Self-pay

## 2024-04-04 DIAGNOSIS — J029 Acute pharyngitis, unspecified: Secondary | ICD-10-CM | POA: Diagnosis not present

## 2024-04-04 DIAGNOSIS — R0989 Other specified symptoms and signs involving the circulatory and respiratory systems: Secondary | ICD-10-CM | POA: Diagnosis not present

## 2024-04-04 DIAGNOSIS — Z5321 Procedure and treatment not carried out due to patient leaving prior to being seen by health care provider: Secondary | ICD-10-CM | POA: Diagnosis not present

## 2024-04-04 DIAGNOSIS — R519 Headache, unspecified: Secondary | ICD-10-CM | POA: Insufficient documentation

## 2024-04-04 DIAGNOSIS — R059 Cough, unspecified: Secondary | ICD-10-CM | POA: Insufficient documentation

## 2024-04-04 LAB — CBC
HCT: 38.6 % (ref 36.0–46.0)
Hemoglobin: 12.6 g/dL (ref 12.0–15.0)
MCH: 26.8 pg (ref 26.0–34.0)
MCHC: 32.6 g/dL (ref 30.0–36.0)
MCV: 82.1 fL (ref 80.0–100.0)
Platelets: 271 K/uL (ref 150–400)
RBC: 4.7 MIL/uL (ref 3.87–5.11)
RDW: 13.4 % (ref 11.5–15.5)
WBC: 7.6 K/uL (ref 4.0–10.5)
nRBC: 0 % (ref 0.0–0.2)

## 2024-04-04 LAB — COMPREHENSIVE METABOLIC PANEL WITH GFR
ALT: 16 U/L (ref 0–44)
AST: 20 U/L (ref 15–41)
Albumin: 3.8 g/dL (ref 3.5–5.0)
Alkaline Phosphatase: 40 U/L (ref 38–126)
Anion gap: 9 (ref 5–15)
BUN: 8 mg/dL (ref 6–20)
CO2: 23 mmol/L (ref 22–32)
Calcium: 9.4 mg/dL (ref 8.9–10.3)
Chloride: 107 mmol/L (ref 98–111)
Creatinine, Ser: 0.78 mg/dL (ref 0.44–1.00)
GFR, Estimated: >60 mL/min (ref >60)
Glucose, Bld: 94 mg/dL (ref 70–99)
Potassium: 3.4 mmol/L — ABNORMAL LOW (ref 3.5–5.1)
Sodium: 139 mmol/L (ref 135–145)
Total Bilirubin: 0.6 mg/dL (ref 0.0–1.2)
Total Protein: 7.6 g/dL (ref 6.5–8.1)

## 2024-04-04 LAB — URINALYSIS, ROUTINE W REFLEX MICROSCOPIC
Bilirubin Urine: NEGATIVE
Glucose, UA: NEGATIVE mg/dL
Hgb urine dipstick: NEGATIVE
Ketones, ur: NEGATIVE mg/dL
Leukocytes,Ua: NEGATIVE
Nitrite: NEGATIVE
Protein, ur: NEGATIVE mg/dL
Specific Gravity, Urine: 1.024 (ref 1.005–1.030)
pH: 5 (ref 5.0–8.0)

## 2024-04-04 LAB — HCG, SERUM, QUALITATIVE: Preg, Serum: NEGATIVE

## 2024-04-04 NOTE — ED Triage Notes (Signed)
 The pt reports headache  cold cough sore throat runny nose for 2-3 days unknown temp chest congestion  lmp July 5th

## 2024-04-05 LAB — RESP PANEL BY RT-PCR (RSV, FLU A&B, COVID)  RVPGX2
Influenza A by PCR: NEGATIVE
Influenza B by PCR: NEGATIVE
Resp Syncytial Virus by PCR: NEGATIVE
SARS Coronavirus 2 by RT PCR: NEGATIVE

## 2024-04-05 LAB — GROUP A STREP BY PCR: Group A Strep by PCR: NOT DETECTED

## 2024-04-05 NOTE — ED Notes (Signed)
 Pt not responding to calls in lobby for rooming. Pt removed from the floor.
